# Patient Record
Sex: Female | Born: 1995 | Race: Black or African American | Hispanic: No | Marital: Single | Smoking: Never smoker
Health system: Southern US, Community
[De-identification: ages and names within clinical notes are randomized; demographics above are authoritative.]

---
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
## 2019-05-05 ENCOUNTER — Other Ambulatory Visit: Payer: Self-pay

## 2019-05-05 DIAGNOSIS — Z20822 Contact with and (suspected) exposure to covid-19: Secondary | ICD-10-CM

## 2019-05-07 LAB — NOVEL CORONAVIRUS, NAA: SARS-CoV-2, NAA: NOT DETECTED

## 2019-05-10 ENCOUNTER — Other Ambulatory Visit: Payer: Self-pay | Admitting: *Deleted

## 2019-05-10 DIAGNOSIS — Z20822 Contact with and (suspected) exposure to covid-19: Secondary | ICD-10-CM

## 2019-05-11 LAB — NOVEL CORONAVIRUS, NAA: SARS-CoV-2, NAA: NOT DETECTED

## 2019-05-12 ENCOUNTER — Other Ambulatory Visit: Payer: Self-pay

## 2019-05-12 DIAGNOSIS — Z20822 Contact with and (suspected) exposure to covid-19: Secondary | ICD-10-CM

## 2019-05-13 LAB — NOVEL CORONAVIRUS, NAA: SARS-CoV-2, NAA: NOT DETECTED

## 2019-06-11 ENCOUNTER — Other Ambulatory Visit: Payer: Self-pay

## 2019-06-11 DIAGNOSIS — Z20822 Contact with and (suspected) exposure to covid-19: Secondary | ICD-10-CM

## 2019-06-12 LAB — NOVEL CORONAVIRUS, NAA: SARS-CoV-2, NAA: NOT DETECTED

## 2019-06-28 ENCOUNTER — Other Ambulatory Visit: Payer: Self-pay

## 2019-06-28 DIAGNOSIS — Z20822 Contact with and (suspected) exposure to covid-19: Secondary | ICD-10-CM

## 2019-06-30 LAB — NOVEL CORONAVIRUS, NAA: SARS-CoV-2, NAA: NOT DETECTED

## 2019-08-09 ENCOUNTER — Ambulatory Visit: Payer: BC Managed Care – PPO | Attending: Internal Medicine

## 2019-08-10 ENCOUNTER — Ambulatory Visit: Payer: BC Managed Care – PPO | Attending: Internal Medicine

## 2019-08-10 DIAGNOSIS — Z20822 Contact with and (suspected) exposure to covid-19: Secondary | ICD-10-CM

## 2019-08-11 LAB — NOVEL CORONAVIRUS, NAA: SARS-CoV-2, NAA: NOT DETECTED

## 2019-08-20 ENCOUNTER — Ambulatory Visit: Payer: BC Managed Care – PPO | Attending: Internal Medicine

## 2019-08-23 ENCOUNTER — Ambulatory Visit: Payer: BC Managed Care – PPO | Attending: Internal Medicine

## 2019-08-23 DIAGNOSIS — Z20822 Contact with and (suspected) exposure to covid-19: Secondary | ICD-10-CM

## 2019-08-24 LAB — NOVEL CORONAVIRUS, NAA: SARS-CoV-2, NAA: NOT DETECTED

## 2019-10-14 ENCOUNTER — Ambulatory Visit: Payer: BC Managed Care – PPO | Attending: Family

## 2019-10-14 DIAGNOSIS — Z23 Encounter for immunization: Secondary | ICD-10-CM | POA: Insufficient documentation

## 2019-10-14 NOTE — Progress Notes (Signed)
   Covid-19 Vaccination Clinic  Name:  Judith Sanders    MRN: [id number] DOB: May 21, 1996  10/14/2019  Judith Sanders was observed post Covid-19 immunization for 15 minutes without incident. She was provided with Vaccine Information Sheet and instruction to access the V-Safe system.   Judith Sanders was instructed to call 911 with any severe reactions post vaccine: Marland Kitchen Difficulty breathing  . Swelling of face and throat  . A fast heartbeat  . A bad rash all over body  . Dizziness and weakness   Immunizations Administered    Name Date Dose VIS Date Route   Moderna COVID-19 Vaccine 10/14/2019 11:38 AM 0.5 mL 07/13/2019 Intramuscular   Manufacturer: Moderna   Lot: [id number]   NDC: 67737-366-81

## 2019-11-16 ENCOUNTER — Ambulatory Visit: Payer: BC Managed Care – PPO | Attending: Family

## 2019-11-16 DIAGNOSIS — Z23 Encounter for immunization: Secondary | ICD-10-CM

## 2019-11-16 NOTE — Progress Notes (Signed)
   Covid-19 Vaccination Clinic  Name:  Judith Sanders    MRN: [id number] DOB: Jan 24, 1996  11/16/2019  [name] was observed post Covid-19 immunization for 15 minutes without incident. She was provided with Vaccine Information Sheet and instruction to access the V-Safe system.   [name] was instructed to call 911 with any severe reactions post vaccine: Marland Kitchen Difficulty breathing  . Swelling of face and throat  . A fast heartbeat  . A bad rash all over body  . Dizziness and weakness   Immunizations Administered    Name Date Dose VIS Date Route   Moderna COVID-19 Vaccine 11/16/2019 12:06 PM 0.5 mL 07/13/2019 Intramuscular   Manufacturer: Moderna   Lot: [id number]   NDC: 61901-222-41

## 2019-11-17 ENCOUNTER — Other Ambulatory Visit: Payer: BC Managed Care – PPO
